# Patient Record
Sex: Female | Born: 1935 | Race: White | Hispanic: No | Smoking: Former smoker
Health system: Southern US, Community
[De-identification: ages and names within clinical notes are randomized; demographics above are authoritative.]

## PROBLEM LIST (undated history)

## (undated) DIAGNOSIS — F0390 Unspecified dementia without behavioral disturbance: Secondary | ICD-10-CM

## (undated) DIAGNOSIS — I10 Essential (primary) hypertension: Secondary | ICD-10-CM

## (undated) DIAGNOSIS — G8929 Other chronic pain: Secondary | ICD-10-CM

## (undated) DIAGNOSIS — M1990 Unspecified osteoarthritis, unspecified site: Secondary | ICD-10-CM

## (undated) DIAGNOSIS — M545 Low back pain, unspecified: Secondary | ICD-10-CM

## (undated) DIAGNOSIS — E785 Hyperlipidemia, unspecified: Secondary | ICD-10-CM

## (undated) DIAGNOSIS — M810 Age-related osteoporosis without current pathological fracture: Secondary | ICD-10-CM

## (undated) DIAGNOSIS — Z8542 Personal history of malignant neoplasm of other parts of uterus: Secondary | ICD-10-CM

## (undated) HISTORY — PX: SPINAL FUSION: SHX223

## (undated) HISTORY — DX: Hyperlipidemia, unspecified: E78.5

## (undated) HISTORY — DX: Low back pain: M54.5

## (undated) HISTORY — DX: Unspecified osteoarthritis, unspecified site: M19.90

## (undated) HISTORY — DX: Age-related osteoporosis without current pathological fracture: M81.0

## (undated) HISTORY — DX: Essential (primary) hypertension: I10

## (undated) HISTORY — DX: Low back pain, unspecified: M54.50

## (undated) HISTORY — DX: Unspecified dementia, unspecified severity, without behavioral disturbance, psychotic disturbance, mood disturbance, and anxiety: F03.90

## (undated) HISTORY — DX: Other chronic pain: G89.29

## (undated) HISTORY — PX: CATARACT EXTRACTION: SUR2

## (undated) HISTORY — DX: Personal history of malignant neoplasm of other parts of uterus: Z85.42

---

## 1999-01-05 ENCOUNTER — Encounter (INDEPENDENT_AMBULATORY_CARE_PROVIDER_SITE_OTHER): Payer: Self-pay | Admitting: Specialist

## 1999-01-05 ENCOUNTER — Other Ambulatory Visit: Admission: RE | Admit: 1999-01-05 | Discharge: 1999-01-05 | Payer: Self-pay | Admitting: Gynecology

## 1999-01-21 ENCOUNTER — Other Ambulatory Visit: Admission: RE | Admit: 1999-01-21 | Discharge: 1999-01-21 | Payer: Self-pay | Admitting: Gynecology

## 1999-01-21 ENCOUNTER — Encounter (INDEPENDENT_AMBULATORY_CARE_PROVIDER_SITE_OTHER): Payer: Self-pay | Admitting: Specialist

## 1999-02-15 ENCOUNTER — Ambulatory Visit: Admission: RE | Admit: 1999-02-15 | Discharge: 1999-02-15 | Payer: Self-pay | Admitting: Gynecology

## 1999-08-03 ENCOUNTER — Encounter (INDEPENDENT_AMBULATORY_CARE_PROVIDER_SITE_OTHER): Payer: Self-pay

## 1999-08-03 ENCOUNTER — Other Ambulatory Visit: Admission: RE | Admit: 1999-08-03 | Discharge: 1999-08-03 | Payer: Self-pay | Admitting: Gynecology

## 1999-08-30 ENCOUNTER — Encounter: Admission: RE | Admit: 1999-08-30 | Discharge: 1999-08-30 | Payer: Self-pay | Admitting: Family Medicine

## 1999-08-30 ENCOUNTER — Encounter: Payer: Self-pay | Admitting: Family Medicine

## 1999-11-17 ENCOUNTER — Ambulatory Visit: Admission: RE | Admit: 1999-11-17 | Discharge: 1999-11-17 | Payer: Self-pay | Admitting: Family Medicine

## 1999-12-30 ENCOUNTER — Encounter: Payer: Self-pay | Admitting: Family Medicine

## 1999-12-30 ENCOUNTER — Encounter: Admission: RE | Admit: 1999-12-30 | Discharge: 1999-12-30 | Payer: Self-pay | Admitting: Family Medicine

## 2000-01-08 ENCOUNTER — Encounter: Payer: Self-pay | Admitting: Emergency Medicine

## 2000-01-08 ENCOUNTER — Emergency Department (HOSPITAL_COMMUNITY): Admission: EM | Admit: 2000-01-08 | Discharge: 2000-01-08 | Payer: Self-pay | Admitting: Emergency Medicine

## 2000-03-08 ENCOUNTER — Encounter (INDEPENDENT_AMBULATORY_CARE_PROVIDER_SITE_OTHER): Payer: Self-pay

## 2000-03-08 ENCOUNTER — Other Ambulatory Visit: Admission: RE | Admit: 2000-03-08 | Discharge: 2000-03-08 | Payer: Self-pay | Admitting: Gynecology

## 2000-05-29 HISTORY — PX: COLONOSCOPY: SHX174

## 2000-08-28 ENCOUNTER — Other Ambulatory Visit: Admission: RE | Admit: 2000-08-28 | Discharge: 2000-08-28 | Payer: Self-pay | Admitting: Gynecology

## 2000-08-29 ENCOUNTER — Encounter (INDEPENDENT_AMBULATORY_CARE_PROVIDER_SITE_OTHER): Payer: Self-pay | Admitting: Specialist

## 2000-08-29 ENCOUNTER — Other Ambulatory Visit: Admission: RE | Admit: 2000-08-29 | Discharge: 2000-08-29 | Payer: Self-pay | Admitting: Gynecology

## 2000-09-20 ENCOUNTER — Ambulatory Visit (HOSPITAL_COMMUNITY): Admission: RE | Admit: 2000-09-20 | Discharge: 2000-09-20 | Payer: Self-pay | Admitting: Gastroenterology

## 2000-09-20 ENCOUNTER — Encounter (INDEPENDENT_AMBULATORY_CARE_PROVIDER_SITE_OTHER): Payer: Self-pay | Admitting: Specialist

## 2000-12-12 ENCOUNTER — Encounter: Payer: Self-pay | Admitting: Family Medicine

## 2000-12-12 ENCOUNTER — Encounter: Admission: RE | Admit: 2000-12-12 | Discharge: 2000-12-12 | Payer: Self-pay | Admitting: Family Medicine

## 2001-03-29 ENCOUNTER — Other Ambulatory Visit: Admission: RE | Admit: 2001-03-29 | Discharge: 2001-03-29 | Payer: Self-pay | Admitting: Gynecology

## 2001-03-29 ENCOUNTER — Encounter (INDEPENDENT_AMBULATORY_CARE_PROVIDER_SITE_OTHER): Payer: Self-pay | Admitting: Specialist

## 2001-04-30 ENCOUNTER — Ambulatory Visit: Admission: RE | Admit: 2001-04-30 | Discharge: 2001-04-30 | Payer: Self-pay | Admitting: Gynecology

## 2001-05-10 ENCOUNTER — Encounter: Payer: Self-pay | Admitting: Gynecology

## 2001-05-14 ENCOUNTER — Encounter (INDEPENDENT_AMBULATORY_CARE_PROVIDER_SITE_OTHER): Payer: Self-pay

## 2001-05-14 ENCOUNTER — Inpatient Hospital Stay (HOSPITAL_COMMUNITY): Admission: RE | Admit: 2001-05-14 | Discharge: 2001-05-15 | Payer: Self-pay | Admitting: Gynecology

## 2002-02-12 ENCOUNTER — Encounter: Payer: Self-pay | Admitting: Family Medicine

## 2002-02-12 ENCOUNTER — Encounter: Admission: RE | Admit: 2002-02-12 | Discharge: 2002-02-12 | Payer: Self-pay | Admitting: Family Medicine

## 2003-02-19 ENCOUNTER — Encounter: Payer: Self-pay | Admitting: Family Medicine

## 2003-02-19 ENCOUNTER — Encounter: Admission: RE | Admit: 2003-02-19 | Discharge: 2003-02-19 | Payer: Self-pay | Admitting: Family Medicine

## 2004-03-03 ENCOUNTER — Ambulatory Visit (HOSPITAL_COMMUNITY): Admission: RE | Admit: 2004-03-03 | Discharge: 2004-03-03 | Payer: Self-pay | Admitting: *Deleted

## 2007-05-02 ENCOUNTER — Encounter: Admission: RE | Admit: 2007-05-02 | Discharge: 2007-05-02 | Payer: Self-pay | Admitting: Neurosurgery

## 2007-06-05 ENCOUNTER — Ambulatory Visit: Admission: RE | Admit: 2007-06-05 | Discharge: 2007-06-05 | Payer: Self-pay | Admitting: Neurosurgery

## 2007-07-15 ENCOUNTER — Observation Stay (HOSPITAL_COMMUNITY): Admission: RE | Admit: 2007-07-15 | Discharge: 2007-07-16 | Payer: Self-pay | Admitting: Neurosurgery

## 2008-10-02 ENCOUNTER — Ambulatory Visit (HOSPITAL_COMMUNITY): Admission: RE | Admit: 2008-10-02 | Discharge: 2008-10-02 | Payer: Self-pay | Admitting: Family Medicine

## 2010-05-31 ENCOUNTER — Encounter: Payer: Self-pay | Admitting: Cardiovascular Disease

## 2010-06-19 ENCOUNTER — Encounter: Payer: Self-pay | Admitting: Family Medicine

## 2010-10-11 NOTE — Op Note (Signed)
NAMEVESNA, KABLE NO.:  [id number]   MEDICAL RECORD NO.:  [id number]          PATIENT TYPE:  OBV   LOCATION:  3533                         FACILITY:  [facility]   PHYSICIAN:  Donalee Citrin, M.D.        DATE OF BIRTH:  09/01/1925   DATE OF PROCEDURE:  07/15/2007  DATE OF DISCHARGE:  07/16/2007                               OPERATIVE REPORT   PREOPERATIVE DIAGNOSIS:  Painful hardware and painful spinous process.   PROCEDURE:  Re-exploration of lumbar fusion, removal of upper right what  I believe was T12 pedicle screw from the [facility] as well as  resection of the superior spinous process to this which I believe was  T11.   SURGEON:  Donalee Citrin, M.D.   ANESTHESIA:  General endotracheal anesthesia.   HISTORY OF PRESENT ILLNESS:  The patient is a very pleasant 77 year old  female who, many years ago, underwent a thoracolumbar fusion using the  Edwards looped rod system for deformity correction.  The patient, over  the last several months, has had progressive worsening pain in her back  with a fluid collection around the superior right screw as well as  significantly displaced and protruding spinous process at the level  above.  Adequate imaging obtained showed strong solid bony fusion at all  levels and the patient requested having the hardware removed as well as  that spinous process resected.  Risks and benefits of the operation  include delayed instability, removal of the hardware was explained.  The  patient understands and agrees to proceed forward.  The patient was  brought to the OR where she received general endotracheal anesthesia,  placed prone on Wilson frame, back was prepped and draped in the usual  sterile fashion.  Her old incision was opened up partially, exposing the  protruding spinous processes which was resected with a Leksell rongeur  flush with the lumbar dorsal fascia.  Then the fluid collection and  right superior pedicle screw was  dissected out.  Clear fluid with some  milky white segments immediately expressed also the bursa sac.  This was  sent for culture, however, did not appear infected, felt to be clear,  more seromatous in nature.  Then the Edwards screw was dissected free  and dissected down, the little pin was removed, removing the washer,  then was attempted to unscrew the nut, however, this was just spinning  in the post and was felt not to be able to remove this.  Then further  dissection around the head of the screw to expose the rod was identified  using a titanium drill bit.  The rod was cut on either side of the screw  and then using that nut remover, the outer head was removed.  This  exposed the post connected with the pedicle screw head.  Then dissection  around the pedicle screw head was obtained and the pedicle screw was  removed.  There was no fluid coming out of the pedicle screw hole.  This  was capped off with wax, then meticulous hemostasis was maintained.  The  wound was copiously irrigated, washing out all the shavings from the  stainless hardware drilling.  Then the lumbodorsal fascia was then  reapproximated with interrupted Vicryl and the skin was closed with  Vicryl and the subcutaneous tissues with a running 4-0 subcuticular.  Benzoin and Steri-Strips applied.  The patient was taken to the recovery  room in stable condition.           ______________________________  Donalee Citrin, M.D.    GC/MEDQ  D:  07/15/2007  T:  07/16/2007  Job:  [id number]

## 2010-10-14 NOTE — Consult Note (Signed)
[facility]  Patient:    Shannon Haynes, COLANTONIO Visit Number: [id number] MRN: [id number]          Service Type: GON Location: GYN Attending Physician:  Jeannette Corpus Dictated by:   Rande Brunt Clarke-Pearson, M.D. Proc. Date: 04/30/01 Admit Date:  04/30/2001   CC:         Leatha Gilding. Mezer, M.D.  Telford Nab, R.N.  Tammy R. Collins Scotland, M.D.   Consultation Report  REASON FOR CONSULTATION:  [age]-year-old white female referred by Dr. Dimas Aguas C. Mezer for consultation regarding management of a newly diagnosed grade 1 endometrial adenocarcinoma.  I initially saw the patient in September of 2000, at which time she had endometrial hyperplasia with atypia.  It was recommended she have a hysterectomy but the patient wished medical management because her husband was ill.  She was then given cyclic Provera.  Followup biopsies in March of 2001 and October of 2001 showed persistent endometrial hyperplasia with no evidence of malignancy.  Apparently, the patient had a biopsy in early of 2002, which is not available but she tells me it was normal.  It is noted that patient was told she could stop her Provera on or about May of 2002.  Subsequently, she developed some abnormal bleeding and underwent a biopsy and a D&C on March 29, 2001 which show a well-differentiated endometrial cancer. Presently, the patient does not have any bleeding, pelvic pain, pressure, GI or GU symptoms.  PAST MEDICAL HISTORY:  Medical illnesses:  Post-polio syndrome.  The patient uses a walker for ambulation around the home and a motorized scooter when she is in public.  She has mild hypertension.  PAST SURGICAL HISTORY:  Patient has had bilateral total hip replacements, the most recent one being within the past year after a hip fracture when she fell off of her scooter.  She has also had spinal fusion surgery and has a painful spinal protrusion.   Appendectomy.  CURRENT MEDICATIONS:  Atenolol, Celebrex, Actonel, Darvocet p.r.n. back pain.  DRUG ALLERGIES:  PENICILLIN, KEFLEX and NEURONTIN.  FAMILY HISTORY:  The patient has a sister who had endometrial cancer and also breast cancer.  There is no other gynecologic, breast or colon cancers in the family history.  REVIEW OF SYSTEMS:  Essentially negative.  SOCIAL HISTORY:  The patient does not smoke.  She comes accompanied by her daughter today who is on the faculty at [employer] at [employer] [facility] [employer] dealing with geriatric issues.  PHYSICAL EXAMINATION:  VITAL SIGNS:  Weight 151 pounds.  Blood pressure 142/84, pulse 60, respiratory rate 18.  GENERAL:  The patient is a healthy white female who is elderly but in no acute distress.  HEENT:  Negative.  NECK:  Supple without thyromegaly.  The patient has some very significant protrusions and edema in her back.  ABDOMEN:  Soft and nontender.  No masses, organomegaly, ascites or herniae are noted.  PELVIC:  EGBUS normal.  The vagina is clean, well-supported.  Cervix has multiple nabothian cysts and a parous os.  Uterus is midplane, normal shape, size and consistency.  There are no adnexal masses noted.  Rectovaginal exam confirms.  LABORATORY WORK:  The patients records from Dr. Valene Bors office are reviewed.  IMPRESSION:  Grade 1 endometrial carcinoma in a patient with a longstanding history of endometrial hyperplasia.  I had a lengthy discussion with the patient and her daughter.  They understand that the standard of care would be to perform an abdominal hysterectomy and bilateral  salpingo-oophorectomy.  Pelvic and para-aortic lymphadenectomy might be performed if she had very deep myometrial invasion.  After discussing the pros and cons and their concerns regarding her recovery, given her post-polio syndrome, I think it would be reasonable to perform a total abdominal hysterectomy and bilateral  salpingo-oophorectomy and peritoneal washings through a Pfannenstiel incision.  We will communicate this with Dr. Chevis Pretty. Dictated by:   Rande Brunt. Clarke-Pearson, M.D. Attending Physician:  Jeannette Corpus DD:  04/30/01 TD:  05/01/01 Job: [id number]

## 2010-10-14 NOTE — Op Note (Signed)
[facility]  Patient:    Shannon Haynes, Shannon Haynes Visit Number: [id number] MRN: [id number]          Service Type: GYN Location: 4W 0467 01 Attending Physician:  Teodora Medici Cabitt Dictated by:   Leatha Gilding. Mezer, M.D. Proc. Date: 05/14/01 Admit Date:  05/14/2001   CC:         Tammy R. Collins Scotland, M.D.  Almedia Balls. Randell Patient, M.D.  Daniel L. Clarke-Pearson, M.D.   Operative Report  PREOPERATIVE DIAGNOSIS:  Endometrial carcinoma of the uterus.  POSTOPERATIVE DIAGNOSIS:  Endometrial carcinoma of the uterus.  OPERATION PERFORMED:  Total abdominal hysterectomy and bilateral salpingo-oophorectomy.  SURGEON:  Leatha Gilding. Mezer, M.D.  ASSISTANT:  Almedia Balls. Randell Patient, M.D.  ANESTHESIA:  General endotracheal.  PREPARATION:  Betadine.  DESCRIPTION OF PROCEDURE:  After very carefully placing the patient on the operating table and assuring her physical deformities were well protected and she was comfortable, the patient underwent the induction of general endotracheal anesthesia. A Pfannenstiel incision was made through the skin and subcutaneous tissue. The fascia and peritoneum were then opened without difficulty. Pelvic washings were obtained. Brief exploration of the upper abdomen revealed a small amount of scarring but no nodes or other abnormalities were palpated. Because of the patients post polio syndrome, she has an extremely prominent pubic symphysis and an OConnor-OSullivan retractor was thought to be the best to be able to obtain exposure. Careful attention was paid to the depths of the blades and towels were placed on the sides of the retractor to limit any pressure on the pelvic nerves. Care was also taken in packing the bowel as not to traumatize the bowel or to interfere with the aorta or vena cava. The uterus, tubes and ovaries were all grossly normal and the hysterectomy was accomplished by suture ligating the round ligaments, dividing with  cautery, skeletonizing the infundibulopelvic ligament after isolating the ureters, clamping the infundibulopelvic ligament, cutting, and free tying with #1 chromic suture and then suture ligating with #1 chromic. The anterior leaf of the broad ligament was opened bilaterally without difficulty and the bladder was significantly adherent to the uterus. This was taken down sharply. Great care was taken with the tissue as the patients age and disease process made the tissue quite fragile. There appeared to be no damage to the bladder. The uterine arteries were clamp clamped, cut and suture ligated with #1 chromic, the cardinal ligaments taken in separate bites, clamped, cut and suture ligated with #1 chromic. The uterosacral ligaments were taken separately clamped, cut and suture ligated with #1 chromic. The angles were then clamped with curved Heaney clamps and the specimen excised. The angles were then closed with Heaney sutures of #1 chromic. The remainder of the fascial cuff was then closed with two interrupted figure-of-eight #1 chromic sutures. The pelvis was irrigated and hemostasis appeared to be perfectly intact. The ureters were reinspected and found to be out of harms way. The bladder was reinspected and found not to have suffered any damage. At the completion of the procedure, an effort was made to place the large bowel back in the cul-de-sac, the omentum was brought down and the abdomen was closed layers using a running 2-0 Vicryl on the peritoneum, running #0 Vicryl at the midline bilaterally on the fascia. Hemostasis was assured in the subcutaneous tissue and the skin was closed with staples. At that time, the pathological analysis was available which showed the disease to be very superficial with essentially no myometrial invasion. Dr.  Ralph Leyden supplied the analysis. The estimated blood loss was approximately 200 cc. The sponge, needle and instrument counts were correct  x 2. The patient tolerated the procedure well and was taken to the recovery room in satisfactory condition. Dictated by:   Leatha Gilding. Mezer, M.D. Attending Physician:  Rolinda Roan DD:  05/14/01 TD:  05/15/01 Job: [id number]

## 2010-10-14 NOTE — H&P (Signed)
[facility]  Patient:    Shannon Haynes, Shannon Haynes Visit Number: [id number] MRN: [id number]          Service Type: GYN Location: 1S X008 01 Attending Physician:  Rolinda Roan Dictated by:   Leatha Gilding. Mezer, M.D. Admit Date:  05/14/2001   CC:         Tammy R. Collins Scotland, M.D.  Daniel L. Clarke-Pearson, M.D.   History and Physical  ADMITTING DIAGNOSIS:  Adenocarcinoma of the endometrium.  HISTORY OF PRESENT ILLNESS:  The patient is a 77 year old gravida 4, para 68 female admitted with adenocarcinoma of the uterus for total abdominal hysterectomy and bilateral salpingo-oophorectomy.  The patient was recently referred by Dr. Herb Grays in 1990 for post menopausal bleeding.  Endometrial biopsy at that time revealed complex atypical endometrial hyperplasia with question of adenocarcinoma.  A hysteroscopy D&C was performed which returned as endometrial polyp with simple hyperplasia without atypia and no features of atypicality or carcinoma were identified.  After review of the biopsies by Dr. Guilford Shi, hysterectomy was recommended to the patient.  Because of her post polio syndrome, the patient sought to consider alternative therapy.  Over the previous two years, the patient has had several consultations with Dr. Reuel Boom L. Clarke-Pearson and myself, regarding the risks of possibly leaving adenocarcinoma in the uterus versus hormonal therapy.  She had chosen to proceed with hormonal therapy and appeared to be well educated as to the risks and benefits.  Endometrial biopsy performed in October of this year was suspicious for endometrial carcinoma and hysteroscopy was performed on March 29, 2001.  At that time a diagnosis of adenocarcinoma of the endometrium was made and at this time the patient has accepted to proceed with a total abdominal hysterectomy and bilateral salpingo-oophorectomy.  The patient has seen Dr. Reuel Boom L. Clarke-Pearson in  consultation, and he will be available on standby if there is deep myometrial invasion and procedures in addition to the total abdominal hysterectomy are required.  A total abdominal hysterectomy and bilateral salpingo-oophorectomy have been reviewed with the patient in detail and potential complications including, but not limited to, anesthesia, entry into the bowel, bladder, ureters, possible fistula formation, possible blood loss with transfusion and sequelae, and possible infection have been discussed with the patient in detail.  The added risks secondary to the patients post polio syndrome have also been discussed.  The patient appears to be well educated and well motivated.  Postoperative restrictions and expectations have been discussed.  PAST MEDICAL HISTORY:  SURGICAL: 1. Bilateral hip x 2. 2. Appendectomy. 3. Leg fracture. 4. Hysteroscopy x 2.  MEDICAL: 1. Post polio syndrome. 2. Hypertension.  MEDICATIONS:  Actonel, atenolol, Celebrex, aspirin, and vitamins.  The patient has stopped the Celebrex, aspirin, and vitamins preoperatively.  ALLERGIES:  PENICILLIN, CEPHALEXIN, and X-RAY DYE.  FAMILY HISTORY:  Positive for carcinoma of the breast and bladder.  SOCIAL HISTORY:  The patient has a supportive family and she is quite independent.  PHYSICAL EXAMINATION:  HEENT:  Negative.  HEART:  Without murmurs.  LUNGS:  Clear.  BREASTS:  Without masses or discharge.  ABDOMEN:  Soft and nontender.  PELVIC:  Reveals _________ , vagina, and cervix to be normal.  The uterus is anterior, top normal in size.  Adnexae without palpable masses.  EXTREMITIES:  Negative.  IMPRESSION:  Adenocarcinoma of the endometrium.  PLAN:  Total abdominal hysterectomy and possible indicated procedures by Dr. Reuel Boom L. Clarke-Pearson. Dictated by:   Leatha Gilding. Mezer, M.D. Attending Physician:  Mezer, Merryl Hacker DD:  05/14/01 TD:  05/14/01 Job: [id number]

## 2010-10-14 NOTE — Procedures (Signed)
[facility]  Patient:    Shannon Haynes, Shannon Haynes              MRN: [id number] Proc. Date: 09/21/00 Adm. Date:  [id number] Attending:  Charna Elizabeth CC:         Tammy R. Collins Scotland, M.D.   Procedure Report  DATE OF BIRTH:  March 24, 1926  REFERRING PHYSICIAN:  Tammy R. Collins Scotland, M.D.  PROCEDURE PERFORMED:  Colonoscopy.  ENDOSCOPIST:  Anselmo Rod, M.D.  INSTRUMENT USED:  Olympus video colonoscope.  INDICATIONS FOR PROCEDURE:  Screening colonoscopy is being performed in a 77 year old Caucasian female with a history of diarrhea.  Rule out colonic polyps, masses, hemorrhoids, etc.  PREPROCEDURE PREPARATION:  Informed consent was procured from the patient. The patient was fasted for eight hours prior to the procedure and prepped with a bottle of magnesium citrate and a gallon of NuLytely the night prior to the procedure.  PREPROCEDURE PHYSICAL:  The patient had stable vital signs.  Neck supple. Chest clear to auscultation.  S1, S2 regular.  Abdomen soft with normal abdominal bowel sounds.  DESCRIPTION OF PROCEDURE:  The patient was placed in the left lateral decubitus position and sedated with 40 mg of Demerol and 2 mg of Versed intravenously.  Once the patient was adequately sedated and maintained on low-flow oxygen and continuous cardiac monitoring, the Olympus video colonoscope was advanced from the rectum to the cecum without difficulty.  The entire colonic mucosa appeared healthy with normal vascular pattern, no erosions, ulcerations, masses or polyps were seen.  The procedure was complete up to cecum.  The ileocecal valve and the appendicular orifice were clearly visualized.  Random biopsies were down to rule out collagenous versus microscopic colitis.  Small internal hemorrhoids were seen on retroflexion.  IMPRESSION: 1. Healthy-appearing colon except for small nonbleeding internal hemorrhoids. 2. Random biopsies done to rule out collagenous  versus microscopic colitis.  RECOMMENDATIONS: 1. Await pathology results. 2. Outpatient follow-up in the next two weeks, earlier if need be.DD:  09/21/00 TD:  09/21/00 Job: [id number]

## 2010-12-27 ENCOUNTER — Encounter: Payer: Self-pay | Admitting: Cardiovascular Disease

## 2010-12-30 ENCOUNTER — Encounter: Payer: Self-pay | Admitting: Cardiovascular Disease

## 2011-01-10 ENCOUNTER — Institutional Professional Consult (permissible substitution): Payer: Self-pay | Admitting: Cardiovascular Disease

## 2011-01-25 ENCOUNTER — Encounter: Payer: Self-pay | Admitting: *Deleted

## 2011-01-26 ENCOUNTER — Encounter: Payer: Self-pay | Admitting: Cardiovascular Disease

## 2011-01-26 ENCOUNTER — Ambulatory Visit (INDEPENDENT_AMBULATORY_CARE_PROVIDER_SITE_OTHER): Payer: Medicare Other | Admitting: Cardiovascular Disease

## 2011-01-26 ENCOUNTER — Ambulatory Visit: Payer: Self-pay | Admitting: Cardiovascular Disease

## 2011-01-26 DIAGNOSIS — I10 Essential (primary) hypertension: Secondary | ICD-10-CM | POA: Insufficient documentation

## 2011-01-26 DIAGNOSIS — R001 Bradycardia, unspecified: Secondary | ICD-10-CM | POA: Insufficient documentation

## 2011-01-26 DIAGNOSIS — I452 Bifascicular block: Secondary | ICD-10-CM

## 2011-01-26 DIAGNOSIS — I498 Other specified cardiac arrhythmias: Secondary | ICD-10-CM

## 2011-01-26 DIAGNOSIS — F0390 Unspecified dementia without behavioral disturbance: Secondary | ICD-10-CM | POA: Insufficient documentation

## 2011-01-26 NOTE — Assessment & Plan Note (Signed)
Asymptomatic.  Stop Aricept.  No indication for pacer

## 2011-01-26 NOTE — Assessment & Plan Note (Signed)
Chronic.  Not indicative of advanced block  Yearly ECG

## 2011-01-26 NOTE — Assessment & Plan Note (Signed)
Well controlled.  Continue current medications and low sodium Dash type diet.    

## 2011-01-26 NOTE — Patient Instructions (Signed)
STOP ARICEPT

## 2011-01-26 NOTE — Assessment & Plan Note (Signed)
Stop Aricept.  Continue Namenda.  F/U neuro and Dr Collins Scotland

## 2011-01-26 NOTE — Progress Notes (Signed)
77 yo referred by Dr Collins Scotland for bradycardia, RBBB and HTN  No symptoms of dyspnea, SSCP, dyspnea or palpitations.  Long standing RBBB with no high grade AV block.  She has progressive demetia and is on Aricept and Namenda.  BP is well Rx and only on very low dose amlodipine as AV nodal blocking drug.  Reviewed ECG from Dr Yehuda Budd office SB 46 RBBB dated 12/07/10.  Seen by Dr Reyes Ivan 3-4 years ago no records available.  Patient and son aren't sure why.  HR in our office 54 and rapidly increases to 90's with ambulation.  Asymptomatic bradycardia in 77 yo needs no further w/u.  RBBB is chronic.  Would stop Aricept given high occurrence of bradycardia with it.  Consider stopping calcium blocker if PR interval lengthens  ROS: Denies fever, malais, weight loss, blurry vision, decreased visual acuity, cough, sputum, SOB, hemoptysis, pleuritic pain, palpitaitons, heartburn, abdominal pain, melena, lower extremity edema, claudication, or rash.  All other systems reviewed and negative   General: Affect appropriate Healthy:  appears stated age HEENT: normal Neck supple with no adenopathy JVP normal no bruits no thyromegaly Lungs clear with no wheezing and good diaphragmatic motion Heart:  S1/S2 no murmur,rub, gallop or click PMI normal Abdomen: benighn, BS positve, no tenderness, no AAA no bruit.  No HSM or HJR Distal pulses intact with no bruits No edema Neuro non-focal Skin warm and dry No muscular weakness Severe khyphosis Poor memory  Medications Current Outpatient Prescriptions  Medication Sig Dispense Refill  . amLODipine (NORVASC) 2.5 MG tablet Take 2.5 mg by mouth daily.        Marland Kitchen aspirin 81 MG tablet Take 81 mg by mouth daily.        . AZOPT 1 % ophthalmic suspension Place 1 drop into both eyes 2 (two) times daily.       . Calcium Carbonate-Vitamin D (CALCIUM 600 + D PO) Take by mouth daily.        . fish oil-omega-3 fatty acids 1000 MG capsule Take 1,200 mg by mouth daily.        Marland Kitchen  latanoprost (XALATAN) 0.005 % ophthalmic solution 1 drop as needed.        . memantine (NAMENDA) 10 MG tablet Take 10 mg by mouth 2 (two) times daily.        . Multiple Vitamin (MULTIVITAMIN) tablet Take 1 tablet by mouth daily.        . valsartan-hydrochlorothiazide (DIOVAN-HCT) 80-12.5 MG per tablet Take 1 tablet by mouth daily.        . Vitamin D, Ergocalciferol, (DRISDOL) 50000 UNITS CAPS Take 50,000 Units by mouth every 7 (seven) days.        . vitamin E 200 UNIT capsule Take 200 Units by mouth daily. ( NOT TAKING )        Allergies Cephalexin; Neurontin; Penicillins; and Tolectin  Family History: Family History  Problem Relation Age of Onset  . Cancer    . Arthritis    . Schizophrenia    . Osteoporosis      Social History: History   Social History  . Marital Status: Widowed    Spouse Name: N/A    Number of Children: N/A  . Years of Education: N/A   Occupational History  . Not on file.   Social History Main Topics  . Smoking status: Former Smoker -- 1.0 packs/day for 30 years    Types: Cigarettes    Quit date: 05/29/1986  . Smokeless tobacco: Never  Used  . Alcohol Use: No  . Drug Use: No  . Sexually Active: Not on file   Other Topics Concern  . Not on file   Social History Narrative  . No narrative on file    Electrocardiogram:  SR 62  PAC LAD RBBB PR 154  Assessment and Plan

## 2011-02-16 LAB — CBC
HCT: 42
Hemoglobin: 14.1
MCHC: 33.6
MCV: 95.4
Platelets: 250
RBC: 4.41
RDW: 13
WBC: 5.6

## 2011-02-16 LAB — BASIC METABOLIC PANEL
BUN: 13
CO2: 31
Calcium: 9.9
Chloride: 100
Creatinine, Ser: 0.7
GFR calc Af Amer: >60
GFR calc non Af Amer: >60
Glucose, Bld: 81
Potassium: 3.8
Sodium: 139

## 2011-02-17 LAB — CBC
HCT: 40.2
Hemoglobin: 13.6
MCHC: 33.8
MCV: 95
Platelets: 276
RBC: 4.24
RDW: 12.9
WBC: 5.5

## 2011-02-17 LAB — ANAEROBIC CULTURE

## 2011-02-17 LAB — BASIC METABOLIC PANEL
BUN: 17
CO2: 30
Calcium: 10.1
Chloride: 101
Creatinine, Ser: 0.78
GFR calc Af Amer: >60
GFR calc non Af Amer: >60
Glucose, Bld: 88
Potassium: 4.3
Sodium: 140

## 2011-02-17 LAB — WOUND CULTURE: Culture: NO GROWTH

## 2011-02-17 LAB — FUNGUS CULTURE W SMEAR

## 2011-11-16 ENCOUNTER — Encounter: Payer: Self-pay | Admitting: *Deleted

## 2014-01-19 ENCOUNTER — Encounter (HOSPITAL_BASED_OUTPATIENT_CLINIC_OR_DEPARTMENT_OTHER): Payer: Self-pay | Admitting: Emergency Medicine

## 2014-01-19 ENCOUNTER — Emergency Department (HOSPITAL_BASED_OUTPATIENT_CLINIC_OR_DEPARTMENT_OTHER): Payer: Medicare HMO

## 2014-01-19 ENCOUNTER — Emergency Department (HOSPITAL_BASED_OUTPATIENT_CLINIC_OR_DEPARTMENT_OTHER)
Admission: EM | Admit: 2014-01-19 | Discharge: 2014-01-19 | Disposition: A | Discharge status: Skilled Nursing Facility | Payer: Medicare HMO | Attending: Emergency Medicine | Admitting: Emergency Medicine

## 2014-01-19 DIAGNOSIS — Z043 Encounter for examination and observation following other accident: Secondary | ICD-10-CM | POA: Diagnosis not present

## 2014-01-19 DIAGNOSIS — G8929 Other chronic pain: Secondary | ICD-10-CM | POA: Insufficient documentation

## 2014-01-19 DIAGNOSIS — Z87891 Personal history of nicotine dependence: Secondary | ICD-10-CM | POA: Insufficient documentation

## 2014-01-19 DIAGNOSIS — R296 Repeated falls: Secondary | ICD-10-CM | POA: Diagnosis not present

## 2014-01-19 DIAGNOSIS — Y929 Unspecified place or not applicable: Secondary | ICD-10-CM | POA: Diagnosis not present

## 2014-01-19 DIAGNOSIS — F0390 Unspecified dementia without behavioral disturbance: Secondary | ICD-10-CM | POA: Diagnosis not present

## 2014-01-19 DIAGNOSIS — I10 Essential (primary) hypertension: Secondary | ICD-10-CM | POA: Insufficient documentation

## 2014-01-19 DIAGNOSIS — Z8669 Personal history of other diseases of the nervous system and sense organs: Secondary | ICD-10-CM | POA: Insufficient documentation

## 2014-01-19 DIAGNOSIS — Z7982 Long term (current) use of aspirin: Secondary | ICD-10-CM | POA: Insufficient documentation

## 2014-01-19 DIAGNOSIS — W19XXXA Unspecified fall, initial encounter: Secondary | ICD-10-CM

## 2014-01-19 DIAGNOSIS — Z880 Allergy status to penicillin: Secondary | ICD-10-CM | POA: Diagnosis not present

## 2014-01-19 DIAGNOSIS — Z8542 Personal history of malignant neoplasm of other parts of uterus: Secondary | ICD-10-CM | POA: Insufficient documentation

## 2014-01-19 DIAGNOSIS — R4182 Altered mental status, unspecified: Secondary | ICD-10-CM | POA: Insufficient documentation

## 2014-01-19 DIAGNOSIS — Z79899 Other long term (current) drug therapy: Secondary | ICD-10-CM | POA: Diagnosis not present

## 2014-01-19 DIAGNOSIS — Y939 Activity, unspecified: Secondary | ICD-10-CM | POA: Diagnosis not present

## 2014-01-19 DIAGNOSIS — M129 Arthropathy, unspecified: Secondary | ICD-10-CM | POA: Insufficient documentation

## 2014-01-19 LAB — URINALYSIS, ROUTINE W REFLEX MICROSCOPIC
BILIRUBIN URINE: NEGATIVE
Glucose, UA: NEGATIVE mg/dL
HGB URINE DIPSTICK: NEGATIVE
Ketones, ur: 15 mg/dL — AB
Leukocytes, UA: NEGATIVE
Nitrite: NEGATIVE
Protein, ur: 30 mg/dL — AB
SPECIFIC GRAVITY, URINE: 1.02 (ref 1.005–1.030)
UROBILINOGEN UA: 0.2 mg/dL (ref 0.0–1.0)
pH: 7 (ref 5.0–8.0)

## 2014-01-19 LAB — URINE MICROSCOPIC-ADD ON

## 2014-01-19 MED ORDER — FENTANYL CITRATE 0.05 MG/ML IJ SOLN
INTRAMUSCULAR | Status: AC
  Filled 2014-01-19: qty 2

## 2014-01-19 MED ORDER — FENTANYL CITRATE 0.05 MG/ML IJ SOLN
25.0000 ug | Freq: Once | INTRAMUSCULAR | Status: AC
  Administered 2014-01-19: 25 ug via INTRAMUSCULAR

## 2014-01-19 NOTE — ED Notes (Signed)
D/c instructions reviewed w/ pt's granddaughter - pt's granddaughter denies any further questions or concerns at present.

## 2014-01-19 NOTE — ED Notes (Signed)
Per pt's granddaughter at bedside - pt w/ an unwitnessed fall some time before 22:00 yesterday evening - pt's son reported the fall to the granddaughter, unsure of any injuries, pt's granddaughter concerned d/t decreased ability to ambulate w/ assistance and for increased confusion past pt's normal baseline mental status - pt w/ hx of dementia and normally has difficulty w/ short term memory. Pt pleasant and cooperative on exam, denies pain, disoriented to situation and time - no obvious injuries noted to head or extremities on assessment.

## 2014-01-19 NOTE — Discharge Instructions (Signed)
Fall Prevention and Home Safety °Falls cause injuries and can affect all age groups. It is possible to prevent falls.  °HOW TO PREVENT FALLS °· Wear shoes with rubber soles that do not have an opening for your toes. °· Keep the inside and outside of your house well lit. °· Use night lights throughout your home. °· Remove clutter from floors. °· Clean up floor spills. °· Remove throw rugs or fasten them to the floor with carpet tape. °· Do not place electrical cords across pathways. °· Put grab bars by your tub, shower, and toilet. Do not use towel bars as grab bars. °· Put handrails on both sides of the stairway. Fix loose handrails. °· Do not climb on stools or stepladders, if possible. °· Do not wax your floors. °· Repair uneven or unsafe sidewalks, walkways, or stairs. °· Keep items you use a lot within reach. °· Be aware of pets. °· Keep emergency numbers next to the telephone. °· Put smoke detectors in your home and near bedrooms. °Ask your doctor what other things you can do to prevent falls. °Document Released: 03/11/2009 Document Revised: 11/14/2011 Document Reviewed: 08/15/2011 °[employer]® Patient Information ©2015 [employer]. This information is not intended to replace advice given to you by your health care provider. Make sure you discuss any questions you have with your health care provider. ° °

## 2014-01-19 NOTE — ED Notes (Signed)
Pt requires ambulance transport back to assisted living facility. [facility] notified of need for transport.

## 2014-01-19 NOTE — ED Provider Notes (Signed)
CSN: [id number]     Arrival date & time 01/19/14  0107 History   First MD Initiated Contact with Patient 01/19/14 0149     Chief Complaint  Patient presents with  . Fall  . Altered Mental Status     (Consider location/radiation/quality/duration/timing/severity/associated sxs/prior Treatment) Patient is a 80 y.o. female presenting with fall and altered mental status. The history is provided by the EMS personnel. The history is limited by the condition of the patient (dementia).  Fall This is a new problem. Episode onset: unknown as was unwitnessed. The problem occurs constantly. The problem has not changed since onset.Pertinent negatives include no chest pain, no abdominal pain, no headaches and no shortness of breath. Nothing aggravates the symptoms. She has tried nothing for the symptoms. The treatment provided no relief.  Altered Mental Status Associated symptoms: no abdominal pain and no headaches     Past Medical History  Diagnosis Date  . Dementia   . Hypertension   . Arthritis   . Glaucoma   . History of cancer of uterus   . Chronic LBP   . OP (osteoporosis)   . Hyperlipidemia    Past Surgical History  Procedure Laterality Date  . Colonoscopy  2002  . Spinal fusion    . Cataract extraction     Family History  Problem Relation Age of Onset  . Cancer    . Arthritis    . Schizophrenia    . Osteoporosis     History  Substance Use Topics  . Smoking status: Former Smoker -- 1.00 packs/day for 30 years    Types: Cigarettes    Quit date: 05/29/1986  . Smokeless tobacco: Never Used  . Alcohol Use: No   OB History   Grav Para Term Preterm Abortions TAB SAB Ect Mult Living                 Review of Systems  Unable to perform ROS Respiratory: Negative for shortness of breath.   Cardiovascular: Negative for chest pain.  Gastrointestinal: Negative for abdominal pain.  Neurological: Negative for headaches.      Allergies  Cephalexin; Neurontin; Penicillins;  and Tolectin  Home Medications   Prior to Admission medications   Medication Sig Start Date End Date Taking? Authorizing Provider  amLODipine (NORVASC) 2.5 MG tablet Take 2.5 mg by mouth daily.      Historical Provider, MD  aspirin 81 MG tablet Take 81 mg by mouth daily.      Historical Provider, MD  AZOPT 1 % ophthalmic suspension Place 1 drop into both eyes 2 (two) times daily.  01/21/11   Historical Provider, MD  Calcium Carbonate-Vitamin D (CALCIUM 600 + D PO) Take by mouth daily.      Historical Provider, MD  fish oil-omega-3 fatty acids 1000 MG capsule Take 1,200 mg by mouth daily.      Historical Provider, MD  latanoprost (XALATAN) 0.005 % ophthalmic solution 1 drop as needed.      Historical Provider, MD  memantine (NAMENDA) 10 MG tablet Take 10 mg by mouth 2 (two) times daily.      Historical Provider, MD  Multiple Vitamin (MULTIVITAMIN) tablet Take 1 tablet by mouth daily.      Historical Provider, MD  valsartan-hydrochlorothiazide (DIOVAN-HCT) 80-12.5 MG per tablet Take 1 tablet by mouth daily.      Historical Provider, MD  Vitamin D, Ergocalciferol, (DRISDOL) 50000 UNITS CAPS Take 50,000 Units by mouth every 7 (seven) days.  Historical Provider, MD  vitamin E 200 UNIT capsule Take 200 Units by mouth daily. ( NOT TAKING )    Historical Provider, MD   BP 116/64  Pulse 70  Temp(Src) 99 F (37.2 C) (Oral)  Resp 20  Ht  (1.803 m)  Wt 130 lb (58.968 kg)  BMI 18.14 kg/m2  SpO2 95% Physical Exam  Constitutional: She appears well-developed and well-nourished.  HENT:  Head: Normocephalic and atraumatic. Head is without raccoon's eyes and without Battle's sign.  Right Ear: No hemotympanum.  Left Ear: No hemotympanum.  Mouth/Throat: Oropharynx is clear and moist.  Eyes: Conjunctivae are normal. Pupils are equal, round, and reactive to light.  Neck: Normal range of motion. Neck supple.  Cardiovascular: Normal rate, regular rhythm and intact distal pulses.    Pulmonary/Chest: Effort normal and breath sounds normal. No respiratory distress. She has no wheezes. She has no rales.  Extreme kyphosis  Abdominal: Soft. Bowel sounds are normal. There is no tenderness. There is no rebound and no guarding.  Musculoskeletal: Normal range of motion. She exhibits no edema and no tenderness.       Right shoulder: Normal.       Left shoulder: Normal.       Right wrist: Normal. She exhibits no bony tenderness.       Left wrist: Normal. She exhibits no bony tenderness.       Right knee: Normal. She exhibits normal range of motion, no swelling, no effusion, no ecchymosis, no deformity, no bony tenderness, normal meniscus and no MCL laxity. No MCL and no LCL tenderness noted.       Left knee: Normal. She exhibits normal range of motion, no swelling, no effusion, no ecchymosis, no deformity, no laceration, no bony tenderness, normal meniscus and no MCL laxity. No MCL and no LCL tenderness noted.  Neurological: She is alert. She has normal reflexes. She displays normal reflexes. No cranial nerve deficit.  Skin: Skin is warm and dry.  Psychiatric: She has a normal mood and affect.    ED Course  Procedures (including critical care time) Labs Review Labs Reviewed - No data to display  Imaging Review No results found.   EKG Interpretation None      MDM   Final diagnoses:  None    Stood and walked in radiology.  B hip prostheses in good position.  AO2.  Safe for discharge      Zeriah Baysinger K Quinzell Malcomb-Rasch, MD 01/19/14 [phone]

## 2014-01-19 NOTE — ED Notes (Signed)
Pt from [facility] sent to ED after noted increased confusion possible d/t recent unwitnessed fall per family member report.

## 2016-02-09 IMAGING — CR DG CHEST 2V
1 series · 1 of 1 positions shown · non-contrast
Comparison: 06/05/2007

CLINICAL DATA: Unwitnessed fall with increased confusion.

EXAM:
CHEST  2 VIEW

[w chest lat]
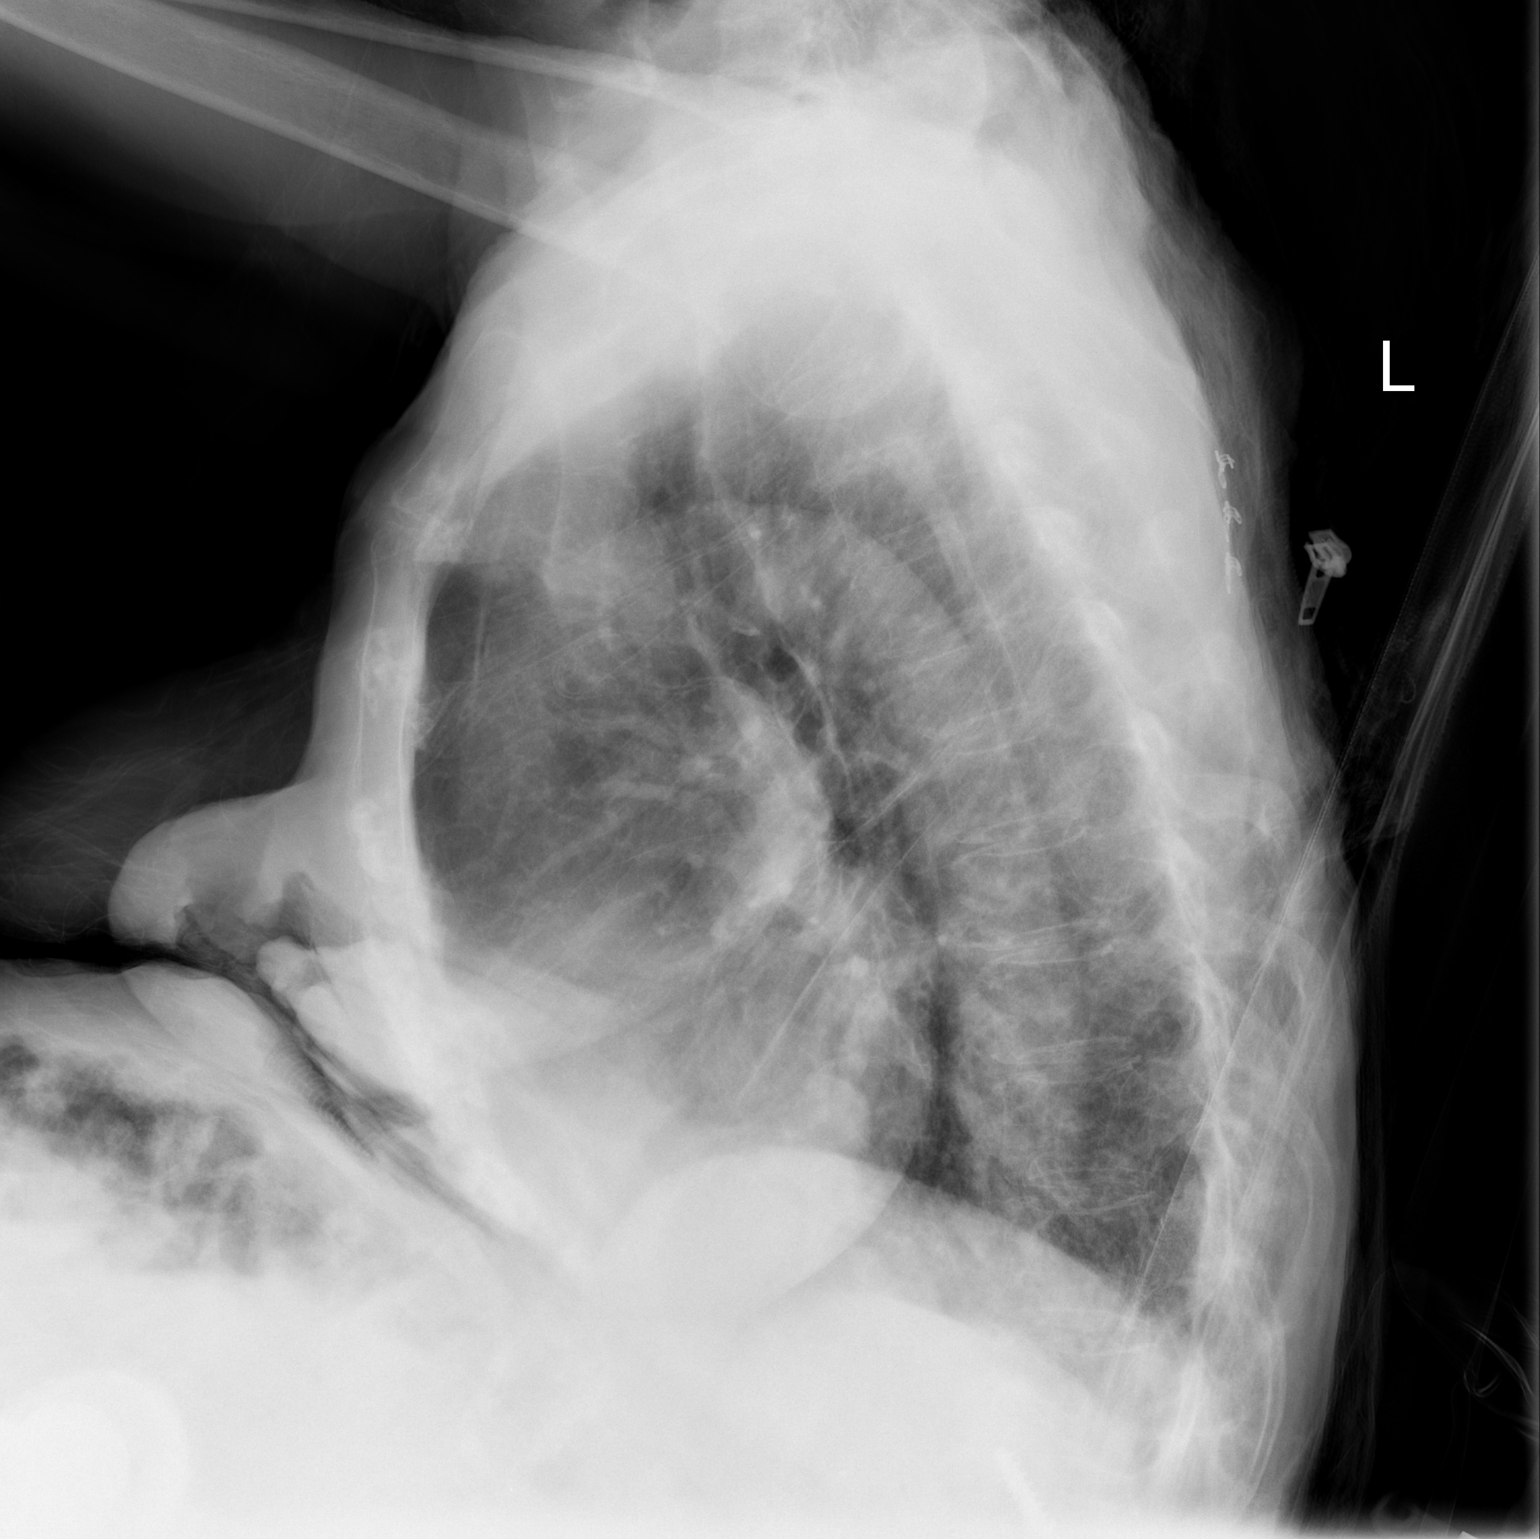

[1 of 1 positions shown; findings below may reference images not displayed]

FINDINGS: Normal heart size and pulmonary vascularity. Emphysematous changes
and scattered fibrosis in the lungs. No focal airspace disease or
consolidation. No pneumothorax. No blunting of costophrenic angles.
No significant change since prior study. Old compression of a lower
thoracic vertebra.
IMPRESSION: Emphysematous changes and scattered fibrosis in the lungs. No
evidence of active pulmonary disease.

## 2016-02-09 IMAGING — CR DG PELVIS 1-2V
2 series · 2 of 2 positions shown · non-contrast
Comparison: CT thoracolumbar spine 05/02/2007

CLINICAL DATA: Unwitnessed fall with increased confusion.

EXAM:
PELVIS - 1-2 VIEW

[view not recorded (1 of 2)]
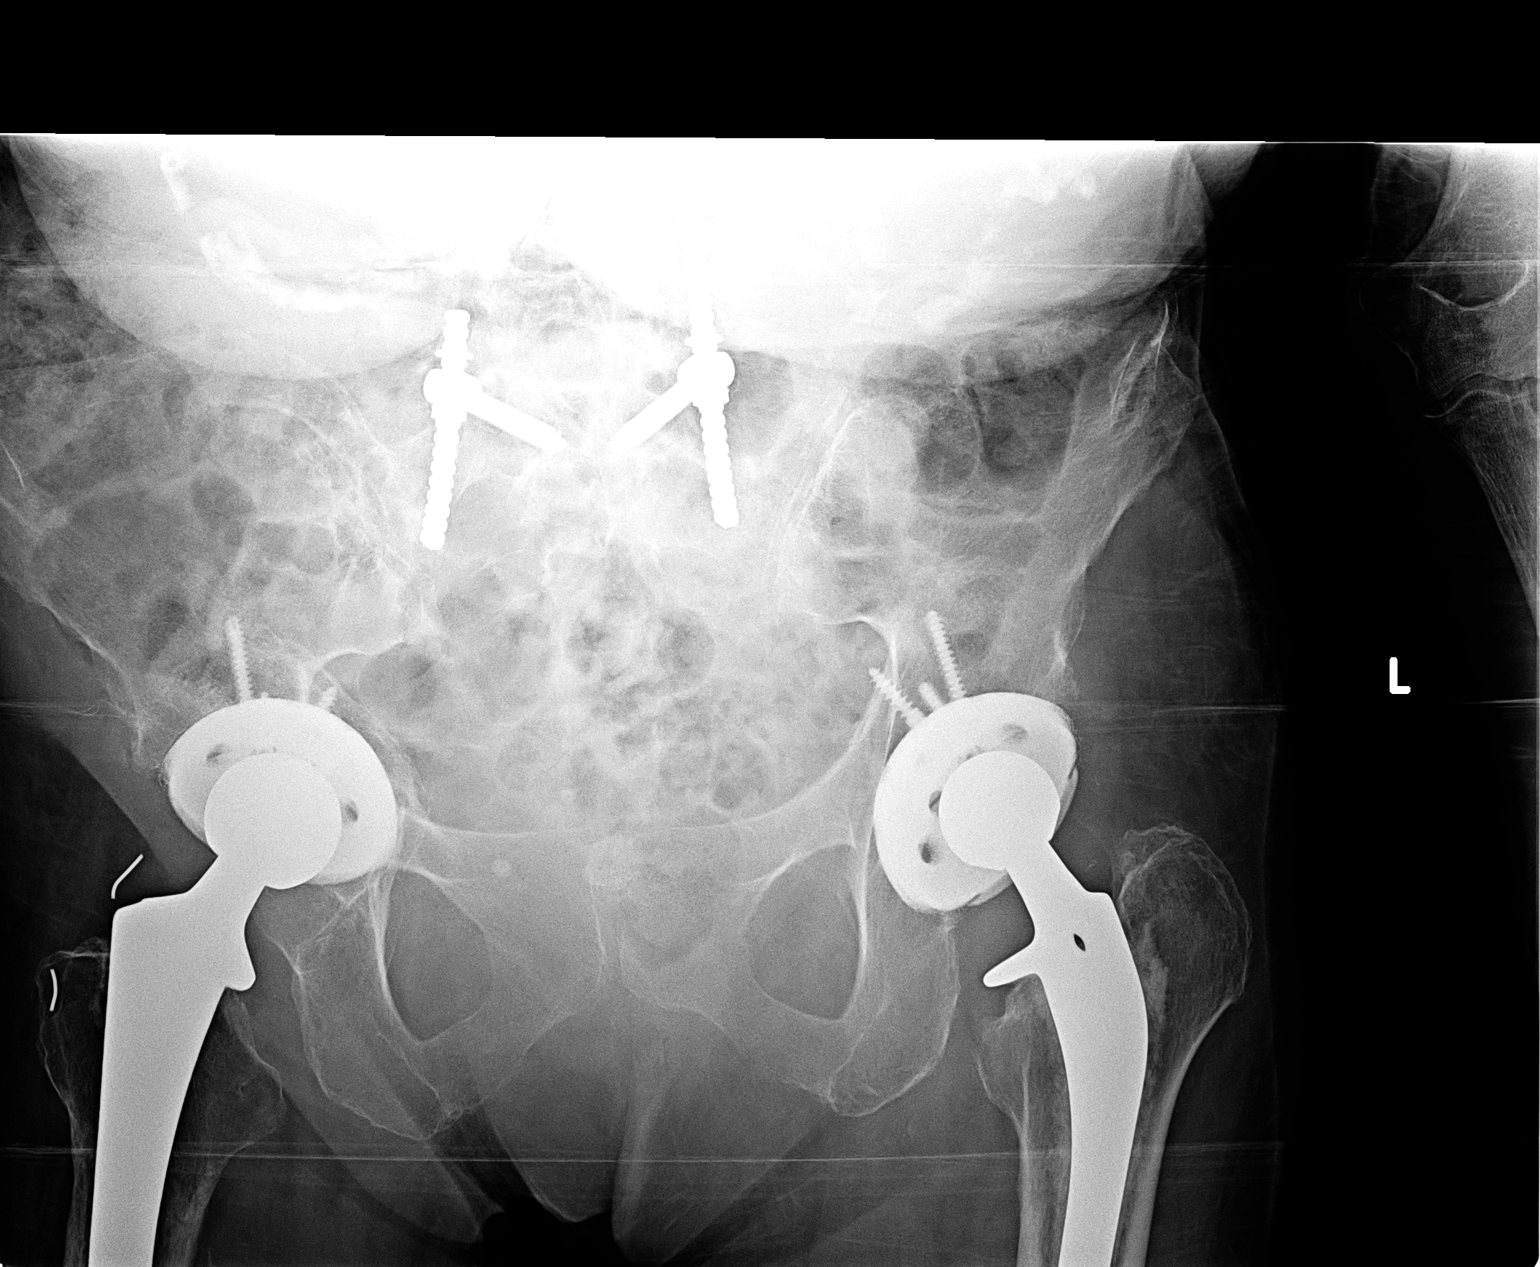

[view not recorded (2 of 2)]
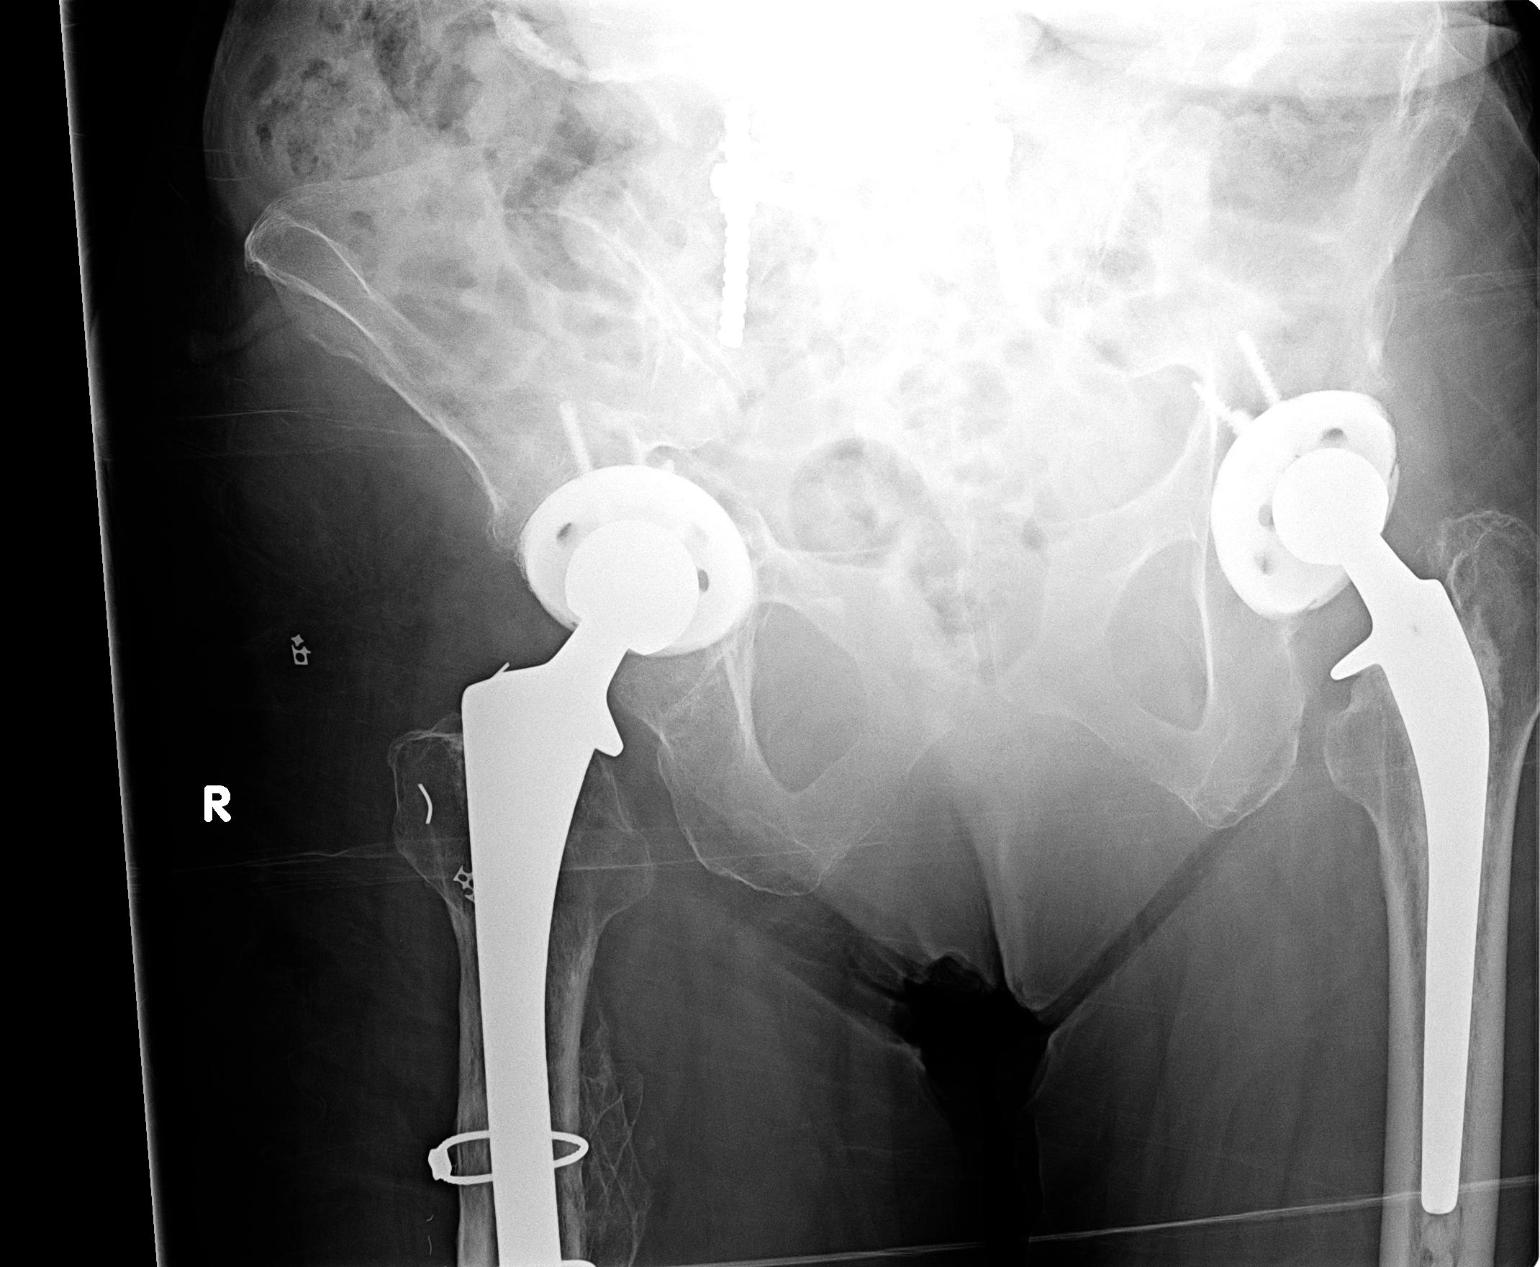

[2 of 2 positions shown; findings below may reference images not displayed]

FINDINGS: Postoperative rod and screw fixation of the lumbosacral spine with
fracture of the right posterior rod, unchanged since prior study.
Bilateral hip arthroplasties. Components appear well seated without
change in position since prior study. Deformity of the right
superior acetabulum appears similar to previous studies. No acute
displaced pelvic fracture is identified.
IMPRESSION: Bilateral hip arthroplasties.  No acute changes suggested.

## 2016-08-27 DEATH — deceased
